# Patient Record
Sex: Male | Born: 2000 | Race: Black or African American | Hispanic: No | Marital: Single | Smoking: Never smoker
Health system: Southern US, Community
[De-identification: ages and names within clinical notes are randomized; demographics above are authoritative.]

---
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
## 2019-07-26 ENCOUNTER — Other Ambulatory Visit: Payer: Self-pay

## 2019-07-26 DIAGNOSIS — Z20822 Contact with and (suspected) exposure to covid-19: Secondary | ICD-10-CM

## 2019-07-28 LAB — NOVEL CORONAVIRUS, NAA: SARS-CoV-2, NAA: NOT DETECTED

## 2020-06-30 ENCOUNTER — Other Ambulatory Visit: Payer: Self-pay

## 2020-06-30 ENCOUNTER — Encounter: Payer: Self-pay | Admitting: Gynecology

## 2020-06-30 ENCOUNTER — Ambulatory Visit
Admission: EM | Admit: 2020-06-30 | Discharge: 2020-06-30 | Disposition: A | Discharge status: Home / Self Care | Payer: Medicaid Other | Attending: Emergency Medicine | Admitting: Emergency Medicine

## 2020-06-30 DIAGNOSIS — H66001 Acute suppurative otitis media without spontaneous rupture of ear drum, right ear: Secondary | ICD-10-CM | POA: Diagnosis not present

## 2020-06-30 MED ORDER — AMOXICILLIN-POT CLAVULANATE 875-125 MG PO TABS: 1.0000 | ORAL_TABLET | Freq: Two times a day (BID) | ORAL | 0 refills | Status: AC

## 2020-06-30 NOTE — ED Triage Notes (Signed)
Patient c/o right ear pain x 1 week.  

## 2020-06-30 NOTE — Discharge Instructions (Signed)
Take the Augmentin twice daily with food for 10 days.  Use over-the-counter ibuprofen and Tylenol as needed for pain.  You can also use a hot water bottle or heating pad underneath your pillowcase at night to help with the discomfort in your ear and help the infection resolve.  If your symptoms do not resolve you need to follow-up with your primary care provider.

## 2020-06-30 NOTE — ED Provider Notes (Signed)
[facility]    CSN: [id number] Arrival date & time: 06/30/20  1450      History   Chief Complaint Chief Complaint  Patient presents with  . Otalgia    HPI Jon Parsons is a 19 y.o. male.   19 year old male here for evaluation of right ear pain.  Patient reports that he has been having pain in his right ear for about 1 week.  He states that he has had ringing in his ear and muffled hearing.  He denies fever, runny nose, sore throat, drainage from his ear, or cough.     History reviewed. No pertinent past medical history.  There are no problems to display for this patient.   History reviewed. No pertinent surgical history.     Home Medications    Prior to Admission medications   Medication Sig Start Date End Date Taking? Authorizing Provider  amoxicillin-clavulanate (AUGMENTIN) 875-125 MG tablet Take 1 tablet by mouth every 12 (twelve) hours for 10 days. 06/30/20 07/10/20  Becky Augusta, NP    Family History Family History  Problem Relation Age of Onset  . Healthy Mother   . Healthy Father     Social History Social History   Tobacco Use  . Smoking status: Never Smoker  . Smokeless tobacco: Never Used  Substance Use Topics  . Alcohol use: No  . Drug use: No     Allergies   Patient has no known allergies.   Review of Systems Review of Systems  Constitutional: Negative for activity change, appetite change and fever.  HENT: Positive for ear pain. Negative for congestion, rhinorrhea, sinus pain and sore throat.   Respiratory: Negative for cough.   Cardiovascular: Negative for chest pain.  Gastrointestinal: Negative for diarrhea and nausea.  Musculoskeletal: Negative for myalgias.  Skin: Negative for rash.  Neurological: Negative for syncope and headaches.  Hematological: Negative.   Psychiatric/Behavioral: Negative.      Physical Exam Triage Vital Signs ED Triage Vitals  Enc Vitals Group     BP 06/30/20 1507 112/71     Pulse  Rate 06/30/20 1507 63     Resp 06/30/20 1507 16     Temp 06/30/20 1507 98.3 F (36.8 C)     Temp Source 06/30/20 1507 Oral     SpO2 06/30/20 1507 98 %     Weight 06/30/20 1504 186 lb (84.4 kg)     Height --      Head Circumference --      Peak Flow --      Pain Score 06/30/20 1504 7     Pain Loc --      Pain Edu? --      Excl. in GC? --    No data found.  Updated Vital Signs BP 112/71 (BP Location: Left Arm)   Pulse 63   Temp 98.3 F (36.8 C) (Oral)   Resp 16   Wt 186 lb (84.4 kg)   SpO2 98%   Visual Acuity Right Eye Distance:   Left Eye Distance:   Bilateral Distance:    Right Eye Near:   Left Eye Near:    Bilateral Near:     Physical Exam Vitals and nursing note reviewed.  Constitutional:      General: He is not in acute distress.    Appearance: Normal appearance. He is normal weight. He is not toxic-appearing.  HENT:     Head: Normocephalic and atraumatic.     Right Ear: Ear canal and  external ear normal.     Left Ear: Tympanic membrane, ear canal and external ear normal.     Ears:     Comments: Left TM and EAC are completely normal.  Right TM is erythematous and injected with a pus collection behind the tympanic membrane.  The tympanic membrane is mildly bulging but there is no perforation.    Nose: Nose normal. No congestion or rhinorrhea.  Eyes:     General: No scleral icterus.    Extraocular Movements: Extraocular movements intact.     Conjunctiva/sclera: Conjunctivae normal.     Pupils: Pupils are equal, round, and reactive to light.  Cardiovascular:     Rate and Rhythm: Normal rate and regular rhythm.     Pulses: Normal pulses.     Heart sounds: Normal heart sounds. No murmur heard.  No gallop.   Pulmonary:     Effort: Pulmonary effort is normal.     Breath sounds: Normal breath sounds. No wheezing, rhonchi or rales.  Musculoskeletal:        General: No swelling or tenderness. Normal range of motion.     Cervical back: Normal range of motion  and neck supple.  Skin:    General: Skin is warm and dry.     Capillary Refill: Capillary refill takes less than 2 seconds.     Findings: No erythema or rash.  Neurological:     General: No focal deficit present.     Mental Status: He is alert and oriented to person, place, and time.  Psychiatric:        Mood and Affect: Mood normal.        Behavior: Behavior normal.        Thought Content: Thought content normal.        Judgment: Judgment normal.      UC Treatments / Results  Labs (all labs ordered are listed, but only abnormal results are displayed) Labs Reviewed - No data to display  EKG   Radiology No results found.  Procedures Procedures (including critical care time)  Medications Ordered in UC Medications - No data to display  Initial Impression / Assessment and Plan / UC Course  I have reviewed the triage vital signs and the nursing notes.  Pertinent labs & imaging results that were available during my care of the patient were reviewed by me and considered in my medical decision making (see chart for details).   Has been experiencing pain in his right ear for about 1 week.  He states that he is ringing in the ear all the time and his hearing is muffled.  He has not had fever or drainage from the ear.  Physical exam reveals a markedly injected and erythematous TM with pus behind the TM and mild bulging of the tympanic membrane without rupture.  Will treat for otitis media with Augmentin twice daily x10 days.  Will have patient use ibuprofen and heat for pain relief.  Patient instructed to follow-up with his primary care provider if his symptoms do not improve or resolve.   Final Clinical Impressions(s) / UC Diagnoses   Final diagnoses:  Non-recurrent acute suppurative otitis media of right ear without spontaneous rupture of tympanic membrane     Discharge Instructions     Take the Augmentin twice daily with food for 10 days.  Use over-the-counter ibuprofen  and Tylenol as needed for pain.  You can also use a hot water bottle or heating pad underneath your pillowcase at night to  help with the discomfort in your ear and help the infection resolve.  If your symptoms do not resolve you need to follow-up with your primary care provider.    ED Prescriptions    Medication Sig Dispense Auth. Provider   amoxicillin-clavulanate (AUGMENTIN) 875-125 MG tablet Take 1 tablet by mouth every 12 (twelve) hours for 10 days. 20 tablet Becky Augusta, NP     PDMP not reviewed this encounter.   Becky Augusta, NP 06/30/20 1536

## 2021-05-03 ENCOUNTER — Emergency Department: Payer: No Typology Code available for payment source

## 2021-05-03 ENCOUNTER — Other Ambulatory Visit: Payer: Self-pay

## 2021-05-03 ENCOUNTER — Encounter: Payer: Self-pay | Admitting: Emergency Medicine

## 2021-05-03 ENCOUNTER — Emergency Department
Admission: EM | Admit: 2021-05-03 | Discharge: 2021-05-03 | Disposition: A | Discharge status: Home / Self Care | Payer: No Typology Code available for payment source | Attending: Emergency Medicine | Admitting: Emergency Medicine

## 2021-05-03 DIAGNOSIS — Y92410 Unspecified street and highway as the place of occurrence of the external cause: Secondary | ICD-10-CM | POA: Diagnosis not present

## 2021-05-03 DIAGNOSIS — S46911A Strain of unspecified muscle, fascia and tendon at shoulder and upper arm level, right arm, initial encounter: Secondary | ICD-10-CM | POA: Insufficient documentation

## 2021-05-03 DIAGNOSIS — S4991XA Unspecified injury of right shoulder and upper arm, initial encounter: Secondary | ICD-10-CM | POA: Diagnosis present

## 2021-05-03 DIAGNOSIS — M549 Dorsalgia, unspecified: Secondary | ICD-10-CM | POA: Diagnosis not present

## 2021-05-03 MED ORDER — CYCLOBENZAPRINE HCL 5 MG PO TABS: 5.0000 mg | ORAL_TABLET | Freq: Three times a day (TID) | ORAL | 0 refills | Status: AC | PRN

## 2021-05-03 MED ORDER — IBUPROFEN 800 MG PO TABS: 800.0000 mg | ORAL_TABLET | Freq: Three times a day (TID) | ORAL | 0 refills | Status: AC | PRN
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
## 2021-06-26 ENCOUNTER — Other Ambulatory Visit: Payer: Self-pay

## 2021-06-26 ENCOUNTER — Encounter: Payer: Self-pay | Admitting: Emergency Medicine

## 2021-06-26 ENCOUNTER — Emergency Department
Admission: EM | Admit: 2021-06-26 | Discharge: 2021-06-26 | Disposition: A | Discharge status: Home / Self Care | Payer: Medicaid Other | Attending: Emergency Medicine | Admitting: Emergency Medicine

## 2021-06-26 DIAGNOSIS — Z202 Contact with and (suspected) exposure to infections with a predominantly sexual mode of transmission: Secondary | ICD-10-CM | POA: Insufficient documentation

## 2021-06-26 DIAGNOSIS — L304 Erythema intertrigo: Secondary | ICD-10-CM | POA: Insufficient documentation

## 2021-06-26 LAB — CHLAMYDIA/NGC RT PCR (ARMC ONLY)
Chlamydia Tr: NOT DETECTED
N gonorrhoeae: NOT DETECTED

## 2021-06-26 MED ORDER — NYSTATIN-TRIAMCINOLONE 100000-0.1 UNIT/GM-% EX OINT: 1.0000 "application " | TOPICAL_OINTMENT | Freq: Two times a day (BID) | CUTANEOUS | 0 refills | Status: AC

## 2021-06-26 MED ORDER — AZITHROMYCIN 500 MG PO TABS
1000.0000 mg | ORAL_TABLET | Freq: Once | ORAL | Status: AC
  Administered 2021-06-26: 1000 mg via ORAL
  Filled 2021-06-26: qty 2

## 2021-06-26 MED ORDER — CEFTRIAXONE SODIUM 1 G IJ SOLR
500.0000 mg | Freq: Once | INTRAMUSCULAR | Status: AC
  Administered 2021-06-26: 500 mg via INTRAMUSCULAR
  Filled 2021-06-26: qty 10

## 2021-06-26 MED ORDER — ONDANSETRON 4 MG PO TBDP
4.0000 mg | ORAL_TABLET | Freq: Once | ORAL | Status: AC
  Administered 2021-06-26: 4 mg via ORAL
  Filled 2021-06-26: qty 1

## 2021-06-26 NOTE — ED Provider Notes (Signed)
[facility] Emergency Department Provider Note  ____________________________________________   Event Date/Time   First MD Initiated Contact with Patient 06/26/21 1841     (approximate)  I have reviewed the triage vital signs and the nursing notes.   HISTORY  Chief Complaint Exposure to STD and Rash    HPI Jon Parsons is a 20 y.o. male here with desire for STD testing.  The patient states that he is here primarily because he has heard stuff is "going around."  He would like to be tested for STDs.  He has been sexually active without protection.  He does not recall any specifically high risk exposures.  He states that he has been otherwise well.  Denies any penile discharge or drainage.  No fevers or chills.  No lymphadenopathy.  He does note that he had a rash in his inguinal area, but this has been ongoing for at least 1 to 2 weeks.  It is mildly itchy.  No drainage.  No bleeding.    History reviewed. No pertinent past medical history.  There are no problems to display for this patient.   History reviewed. No pertinent surgical history.  Prior to Admission medications   Medication Sig Start Date End Date Taking? Authorizing Provider  nystatin-triamcinolone ointment (MYCOLOG) Apply 1 application topically 2 (two) times daily for 7 days. To area of rash 06/26/21 07/03/21 Yes Shaune Pollack, MD  cyclobenzaprine (FLEXERIL) 5 MG tablet Take 1 tablet (5 mg total) by mouth 3 (three) times daily as needed. 05/03/21   Menshew, Charlesetta Ivory, PA-C  ibuprofen (ADVIL) 800 MG tablet Take 1 tablet (800 mg total) by mouth every 8 (eight) hours as needed. 05/03/21   Menshew, Charlesetta Ivory, PA-C    Allergies Patient has no known allergies.  Family History  Problem Relation Age of Onset   Healthy Mother    Healthy Father     Social History Social History   Tobacco Use   Smoking status: Never   Smokeless tobacco: Never  Substance Use Topics   Alcohol use:  No   Drug use: No    Review of Systems  Review of Systems  Constitutional:  Negative for chills, fatigue and fever.  HENT:  Negative for sore throat.   Respiratory:  Negative for shortness of breath.   Cardiovascular:  Negative for chest pain.  Gastrointestinal:  Negative for abdominal pain.  Genitourinary:  Negative for flank pain.  Musculoskeletal:  Negative for neck pain.  Skin:  Positive for rash. Negative for wound.  Allergic/Immunologic: Negative for immunocompromised state.  Neurological:  Negative for weakness and numbness.  Hematological:  Does not bruise/bleed easily.  All other systems reviewed and are negative.   ____________________________________________  PHYSICAL EXAM:      VITAL SIGNS: ED Triage Vitals [06/26/21 1744]  Enc Vitals Group     BP (!) 155/89     Pulse Rate 79     Resp 18     Temp 98.7 F (37.1 C)     Temp Source Oral     SpO2 99 %     Weight 179 lb 14.3 oz (81.6 kg)     Height 5\' 11"  (1.803 m)     Head Circumference      Peak Flow      Pain Score 0     Pain Loc      Pain Edu?      Excl. in GC?      Physical Exam  Vitals and nursing note reviewed.  Constitutional:      General: He is not in acute distress.    Appearance: He is well-developed.  HENT:     Head: Normocephalic and atraumatic.  Eyes:     Conjunctiva/sclera: Conjunctivae normal.  Cardiovascular:     Rate and Rhythm: Normal rate and regular rhythm.     Heart sounds: Normal heart sounds. No murmur heard.   No friction rub.  Pulmonary:     Effort: Pulmonary effort is normal. No respiratory distress.     Breath sounds: Normal breath sounds. No wheezing or rales.  Abdominal:     General: There is no distension.     Palpations: Abdomen is soft.     Tenderness: There is no abdominal tenderness.  Genitourinary:    Comments: Superficial mildly erythematous rash along skin folds bilateral inguinal area. No drainage or bleeding. No induration. Musculoskeletal:     Cervical  back: Neck supple.  Skin:    General: Skin is warm.     Capillary Refill: Capillary refill takes less than 2 seconds.  Neurological:     Mental Status: He is alert and oriented to person, place, and time.     Motor: No abnormal muscle tone.      ____________________________________________   LABS (all labs ordered are listed, but only abnormal results are displayed)  Labs Reviewed  CHLAMYDIA/NGC RT PCR ([facility] ONLY)            HIV ANTIBODY (ROUTINE TESTING W REFLEX)  RPR    ____________________________________________  EKG:  ________________________________________  RADIOLOGY All imaging, including plain films, CT scans, and ultrasounds, independently reviewed by me, and interpretations confirmed via formal radiology reads.  ED MD interpretation:     Official radiology report(s): No results found.  ____________________________________________  PROCEDURES   Procedure(s) performed (including Critical Care):  Procedures  ____________________________________________  INITIAL IMPRESSION / MDM / ASSESSMENT AND PLAN / ED COURSE  As part of my medical decision making, I reviewed the following data within the electronic MEDICAL RECORD NUMBER Nursing notes reviewed and incorporated, Old chart reviewed, Notes from prior ED visits, and [place] Controlled Substance Database       *Jon Parsons was evaluated in Emergency Department on 06/26/2021 for the symptoms described in the history of present illness. He was evaluated in the context of the global COVID-19 pandemic, which necessitated consideration that the patient might be at risk for infection with the SARS-CoV-2 virus that causes COVID-19. Institutional protocols and algorithms that pertain to the evaluation of patients at risk for COVID-19 are in a state of rapid change based on information released by regulatory bodies including the CDC and federal and state organizations. These policies and algorithms were followed during the  patient's care in the ED.  Some ED evaluations and interventions may be delayed as a result of limited staffing during the pandemic.*     Medical Decision Making: 20 year old male here with desire for STD testing.  After discussing risks and benefits of empiric treatment, patient given a dose of Rocephin and azithromycin.  Tolerated well.  He has no objective evidence of STD on exam.  Regarding his rash, this is consistent with likely intertrigo.  He will be treated with nystatin and triamcinolone.  Return precautions given.  No signs of superimposed cellulitis.  The rash is not consistent with syphilis, though this is also been sent.  HIV has also been sent.  ____________________________________________  FINAL CLINICAL IMPRESSION(S) / ED DIAGNOSES  Final diagnoses:  Possible exposure to STD  Intertrigo     MEDICATIONS GIVEN DURING THIS VISIT:  Medications  cefTRIAXone (ROCEPHIN) injection 500 mg (500 mg Intramuscular Given 06/26/21 1927)  azithromycin (ZITHROMAX) tablet 1,000 mg (1,000 mg Oral Given 06/26/21 1926)  ondansetron (ZOFRAN-ODT) disintegrating tablet 4 mg (4 mg Oral Given 06/26/21 1927)     ED Discharge Orders          Ordered    nystatin-triamcinolone ointment (MYCOLOG)  2 times daily        06/26/21 1900             Note:  This document was prepared using Dragon voice recognition software and may include unintentional dictation errors.   Shaune Pollack, MD 06/26/21 2154

## 2021-06-26 NOTE — ED Triage Notes (Signed)
Pt comes into the ED via POV c/o rash on the left leg towards the groin area.  Pt also would like to be tested for STD's.  PT in NAD at this time and is ambulatory to triage.

## 2021-06-27 LAB — RPR: RPR Ser Ql: NONREACTIVE

## 2021-06-27 LAB — HIV ANTIBODY (ROUTINE TESTING W REFLEX): HIV Screen 4th Generation wRfx: NONREACTIVE

## 2023-11-14 ENCOUNTER — Emergency Department
Admission: EM | Admit: 2023-11-14 | Discharge: 2023-11-14 | Disposition: A | Discharge status: Home / Self Care | Attending: Emergency Medicine | Admitting: Emergency Medicine

## 2023-11-14 ENCOUNTER — Emergency Department

## 2023-11-14 ENCOUNTER — Other Ambulatory Visit: Payer: Self-pay

## 2023-11-14 DIAGNOSIS — M79602 Pain in left arm: Secondary | ICD-10-CM | POA: Insufficient documentation

## 2023-11-14 DIAGNOSIS — R0789 Other chest pain: Secondary | ICD-10-CM | POA: Insufficient documentation

## 2023-11-14 DIAGNOSIS — M542 Cervicalgia: Secondary | ICD-10-CM | POA: Diagnosis not present

## 2023-11-14 DIAGNOSIS — M549 Dorsalgia, unspecified: Secondary | ICD-10-CM | POA: Diagnosis not present

## 2023-11-14 DIAGNOSIS — Y92410 Unspecified street and highway as the place of occurrence of the external cause: Secondary | ICD-10-CM | POA: Diagnosis not present

## 2023-11-14 MED ORDER — HYDROCODONE-ACETAMINOPHEN 5-325 MG PO TABS
1.0000 | ORAL_TABLET | Freq: Once | ORAL | Status: AC
  Administered 2023-11-14: 1 via ORAL
  Filled 2023-11-14: qty 1

## 2023-11-14 MED ORDER — ONDANSETRON 4 MG PO TBDP
4.0000 mg | ORAL_TABLET | Freq: Once | ORAL | Status: AC
  Administered 2023-11-14: 4 mg via ORAL
  Filled 2023-11-14: qty 1
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
